# Patient Record
Sex: Male | Born: 1970 | Race: White | Hispanic: No | Marital: Married
Health system: Southern US, Community
[De-identification: ages and names within clinical notes are randomized; demographics above are authoritative.]

---

## 2005-01-28 ENCOUNTER — Ambulatory Visit: Payer: Self-pay

## 2005-02-26 ENCOUNTER — Ambulatory Visit: Payer: Self-pay

## 2006-09-13 ENCOUNTER — Ambulatory Visit: Payer: Self-pay | Admitting: Gastroenterology

## 2006-09-13 IMAGING — US ABDOMEN ULTRASOUND
1 series · 17 of 25 positions shown · non-contrast
Comparison: none

REASON FOR EXAM: RUQ LUQ epigastric pain nausea
COMMENTS:

[Series 1: abdomen ultrasound · 17 of 56 slices shown]
[im 1/56]
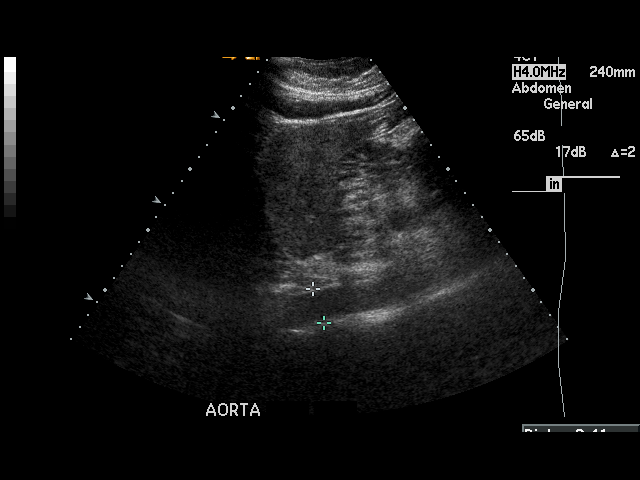
[im 5/56]
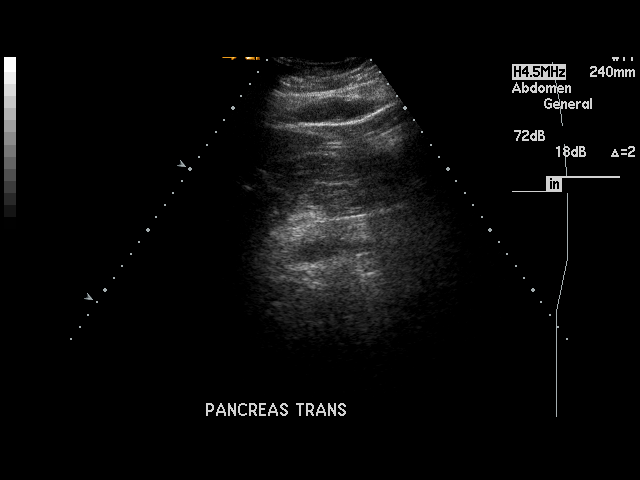
[im 7/56]
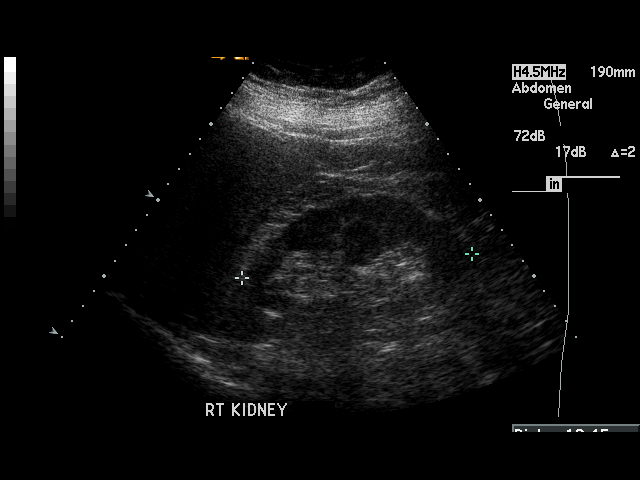
[im 12/56]
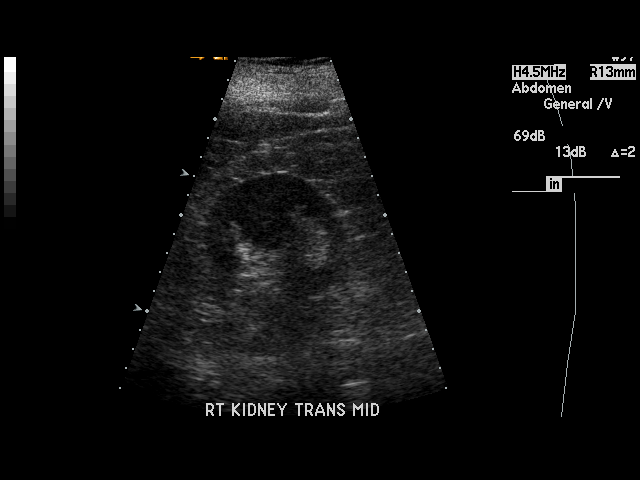
[im 14/56]
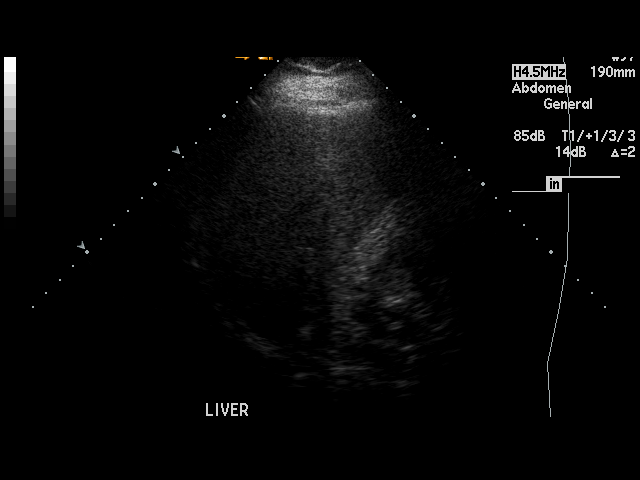
[im 19/56]
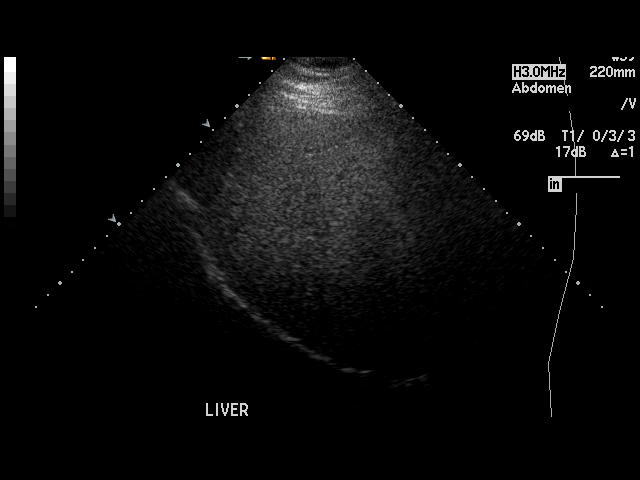
[im 21/56]
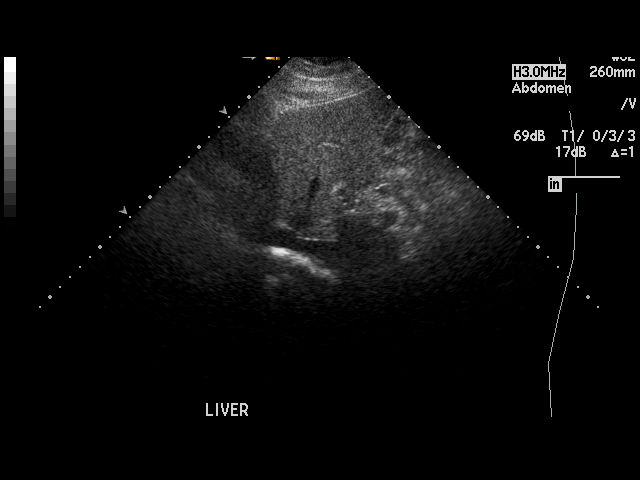
[im 26/56]
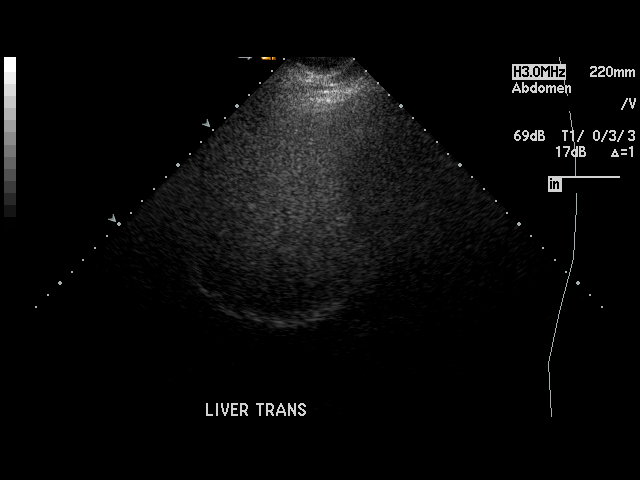
[im 28/56]
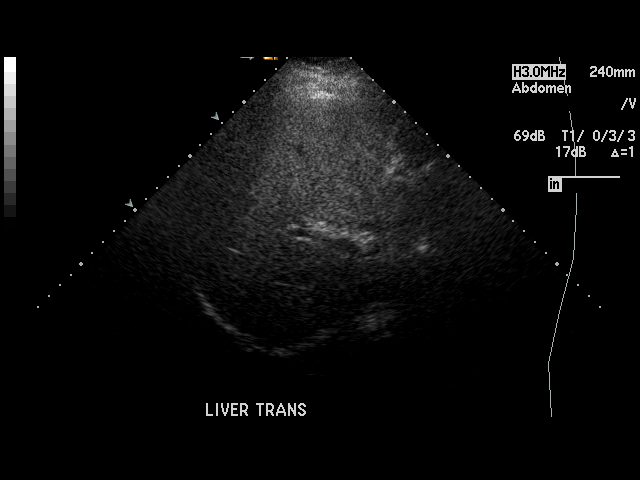
[im 30/56]
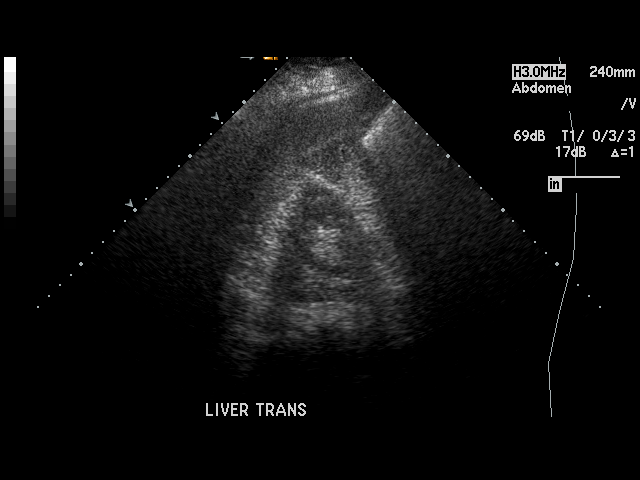
[im 35/56]
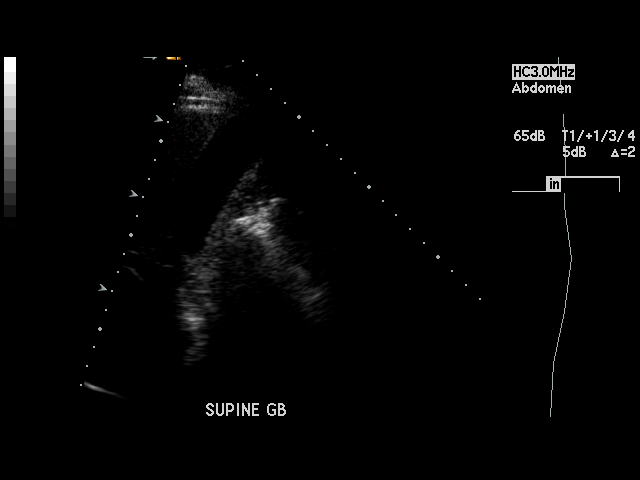
[im 37/56]
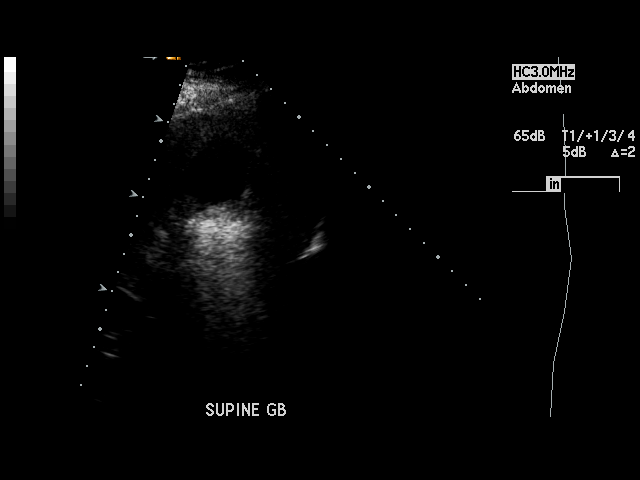
[im 42/56]
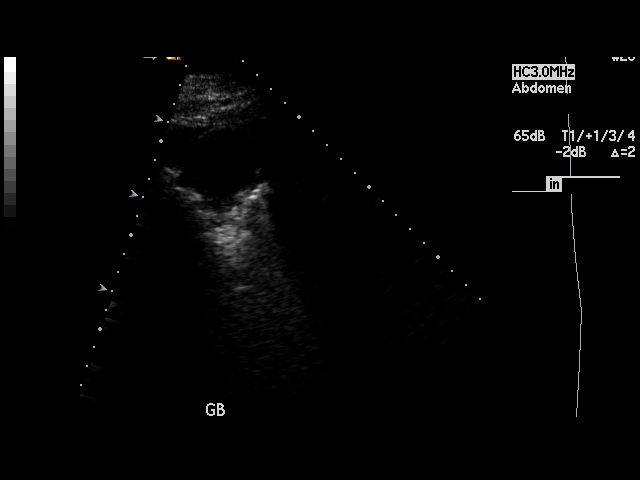
[im 44/56]
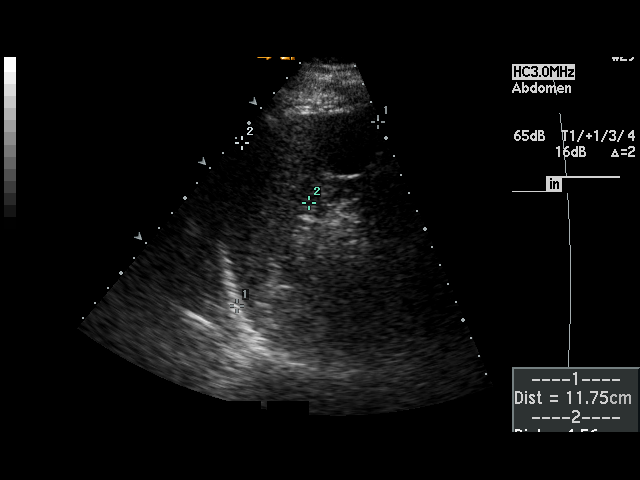
[im 49/56]
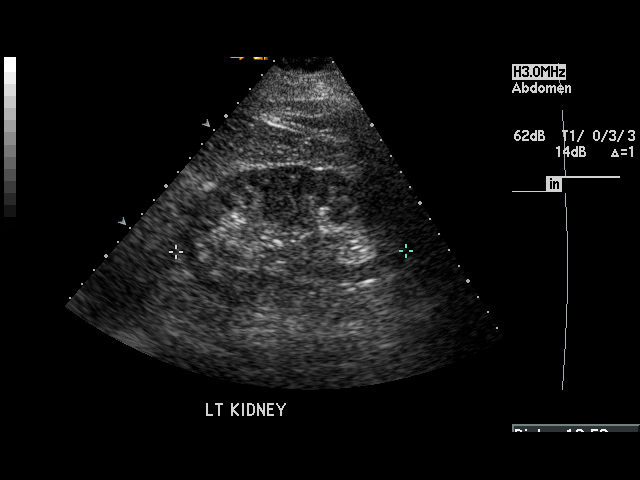
[im 51/56]
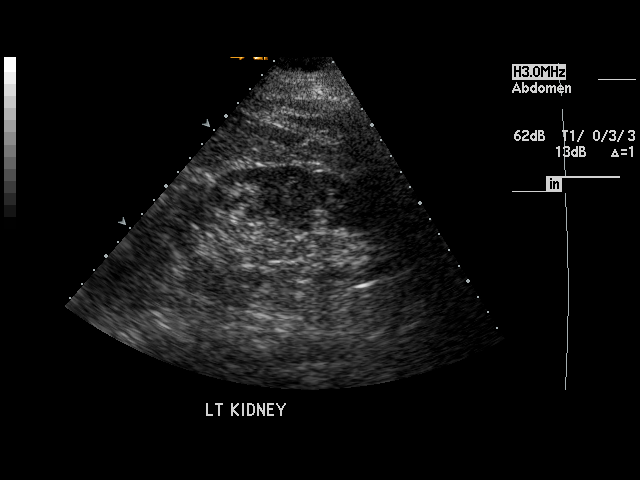
[im 56/56]
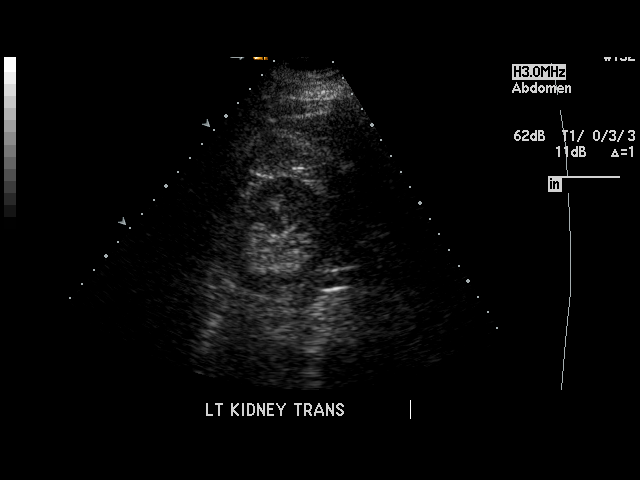

[17 of 25 positions shown; findings below may reference images not displayed]

PROCEDURE:     US  - US ABDOMEN GENERAL SURVEY  - [DATE]  [DATE]

RESULT:     The liver is dense, suspicious for fatty infiltration. No focal
hepatic mass lesions are seen. The spleen is normal in size. The pancreatic
tail is not well seen but the pancreas otherwise is normal appearance. No
gallstones are seen. There is no thickening the gallbladder wall. The common
bile duct measures 3.4 mm in diameter which is within normal limits. The
kidneys show no hydronephrosis. There is no ascites.
IMPRESSION: Probable fatty infiltration of the liver. Otherwise normal study.

## 2006-09-17 ENCOUNTER — Ambulatory Visit: Payer: Self-pay | Admitting: Gastroenterology

## 2006-09-17 IMAGING — NM NUCLEAR MEDICINE HEPATOHBILIARY INCLUDE GB
3 series · 21 of 21 positions shown · non-contrast
Comparison: none

REASON FOR EXAM: RUQ and LUQ epigastric pain, nausea
COMMENTS:

[Series 1000: gallbladder dynamic (results) · 4.80mm/px · 6 of 60 frames shown]
[frame 6/60]
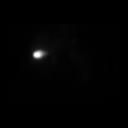
[frame 16/60]
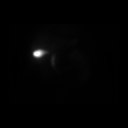
[frame 26/60]
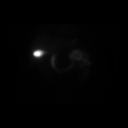
[frame 36/60]
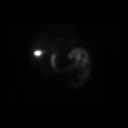
[frame 46/60]
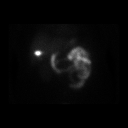
[frame 56/60]
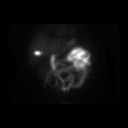

[Series 1000: gallbladder statics · 4.80mm/px · 9 of 9 slices shown]
[im 1/9]
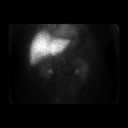
[im 2/9]
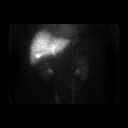
[im 3/9]
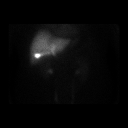
[im 4/9]
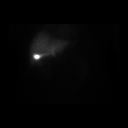
[im 5/9]
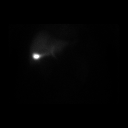
[im 6/9]
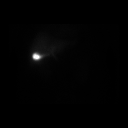
[im 7/9]
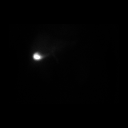
[im 8/9]
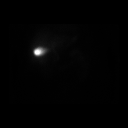
[im 9/9]
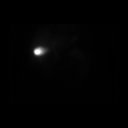

[Series 1000: gallbladder dynamic · 4.80mm/px · 6 of 60 frames shown]
[frame 6/60]
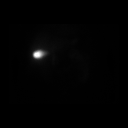
[frame 16/60]
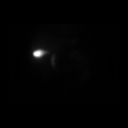
[frame 26/60]
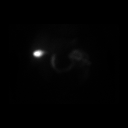
[frame 36/60]
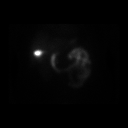
[frame 46/60]
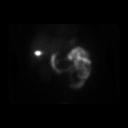
[frame 56/60]
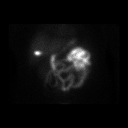

[21 of 21 positions shown; findings below may reference images not displayed]

PROCEDURE:     NM  - NM HEPATO WITH GB EJECT FRACTION  - [DATE]  [DATE]

RESULT:     Following injection of 7.18 mCi of Technetium 99m Choletec,
there is noted prompt visualization of tracer activity in the liver at 3
minutes. At 55 minutes, tracer activity is visualized in the gallbladder,
common duct and proximal small bowel.

The gallbladder ejection fraction at 30 minutes measures 94% which is within
the normal range.
IMPRESSION: 1.  Normal Hepatobiliary Scan.
2.  The gallbladder ejection fraction measures 94% which is in the normal
range.

## 2006-09-25 ENCOUNTER — Ambulatory Visit: Payer: Self-pay | Admitting: Gastroenterology

## 2006-09-30 ENCOUNTER — Ambulatory Visit: Payer: Self-pay | Admitting: Emergency Medicine

## 2006-09-30 ENCOUNTER — Emergency Department: Payer: Self-pay | Admitting: Emergency Medicine

## 2006-09-30 ENCOUNTER — Other Ambulatory Visit: Payer: Self-pay

## 2006-09-30 IMAGING — CT CT ABD-PELV W/ CM
1 of 2 series · 15 of 32 positions shown, 20 images · non-contrast
Comparison: none

REASON FOR EXAM: epigastric pain, satiety x 1 month
COMMENTS:

[Series 2: abdomen · axial · 0.84mm/px · z∈[-564,-76]mm · 15 of 67 slices shown, 20 images]
[im 3/67  soft-tissue]
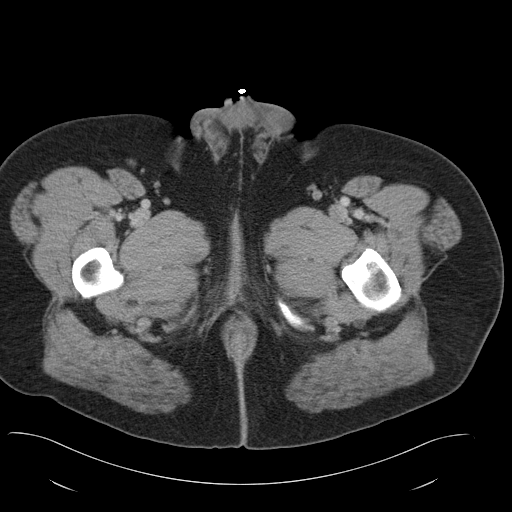
[im 3/67  bone]
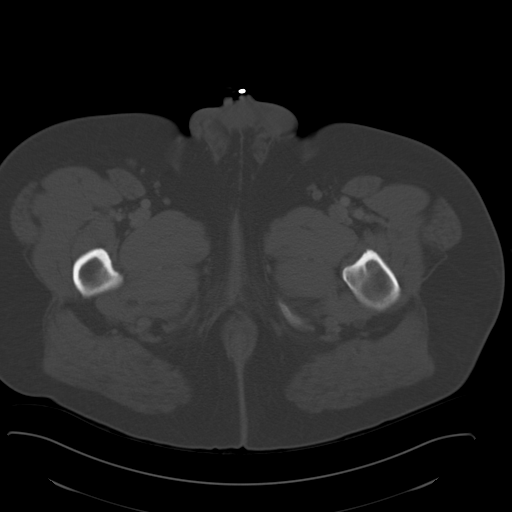
[im 9/67  soft-tissue]
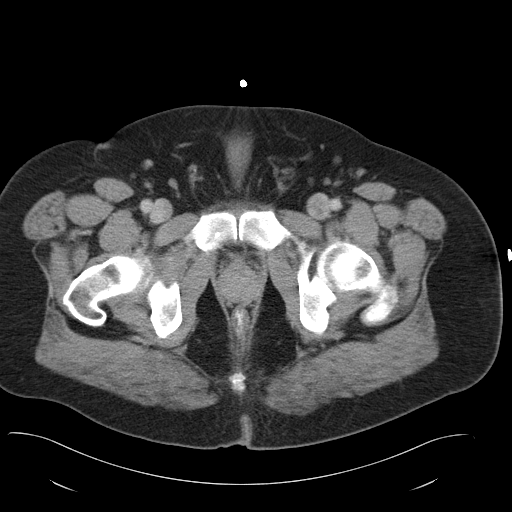
[im 12/67  soft-tissue]
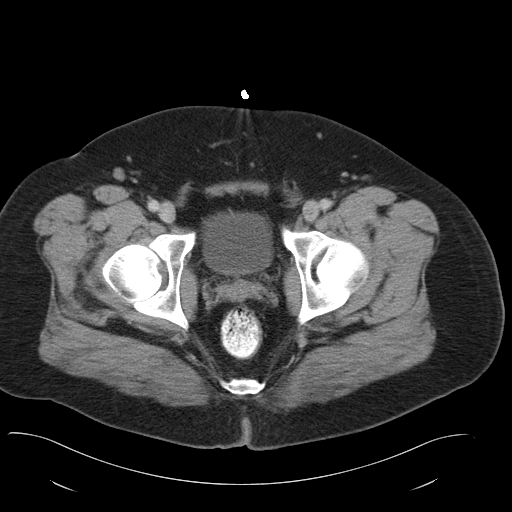
[im 18/67  soft-tissue]
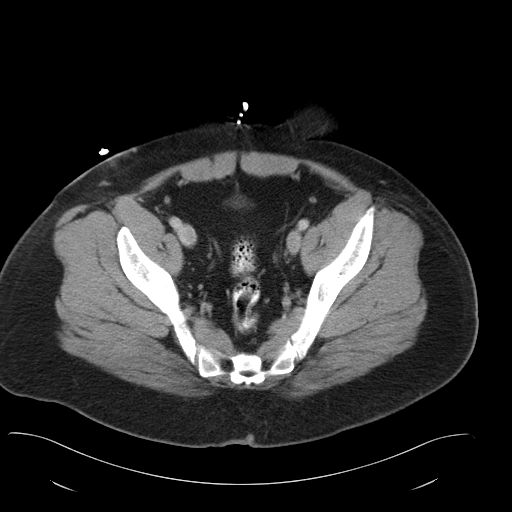
[im 23/67  soft-tissue]
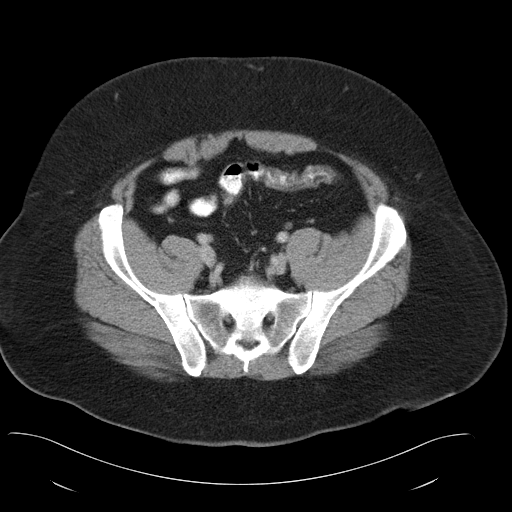
[im 26/67  soft-tissue]
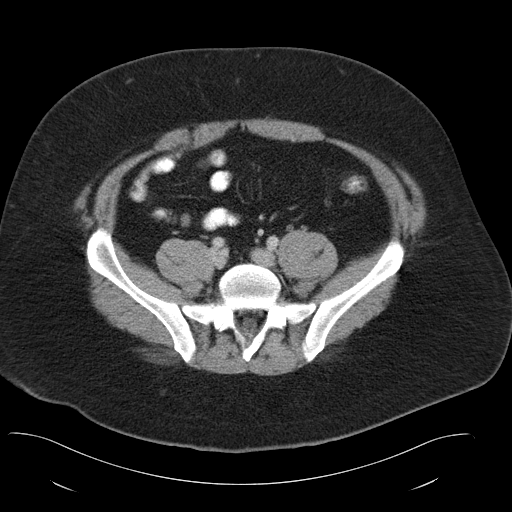
[im 32/67  soft-tissue]
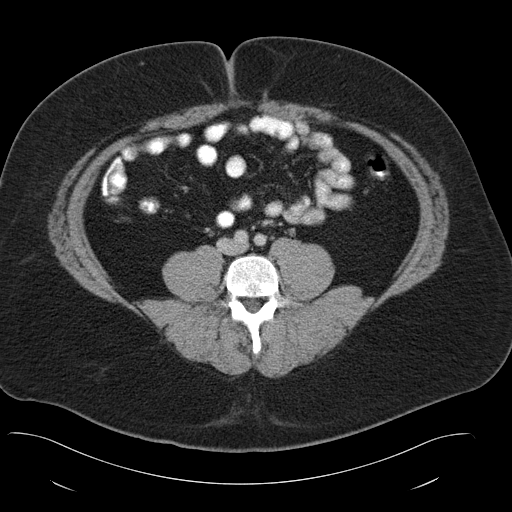
[im 35/67  soft-tissue]
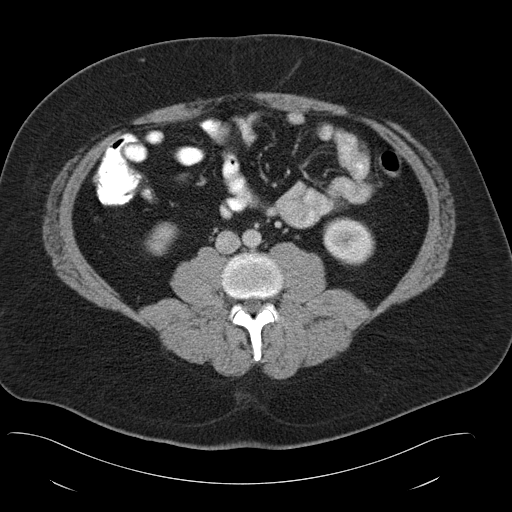
[im 41/67  soft-tissue]
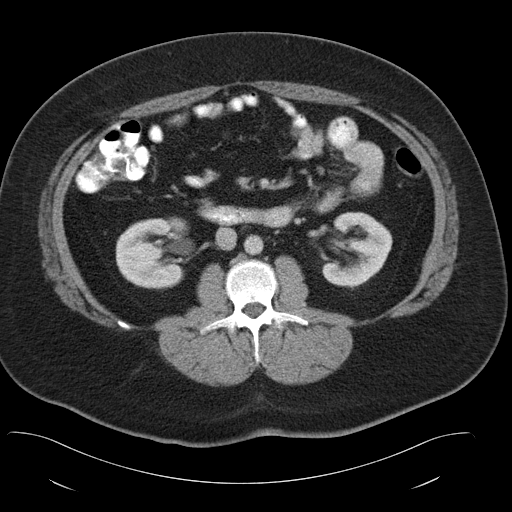
[im 41/67  bone]
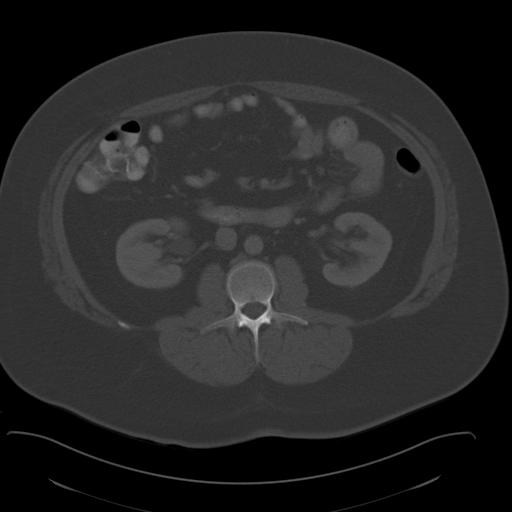
[im 44/67  soft-tissue]
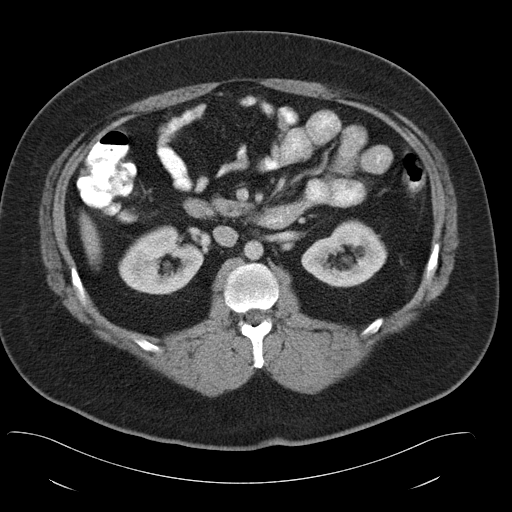
[im 49/67  soft-tissue]
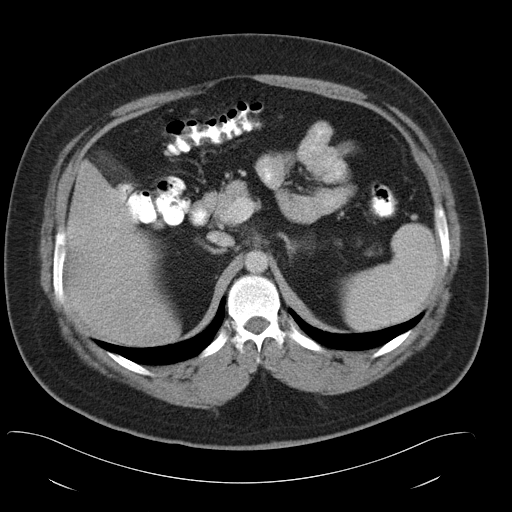
[im 55/67  soft-tissue]
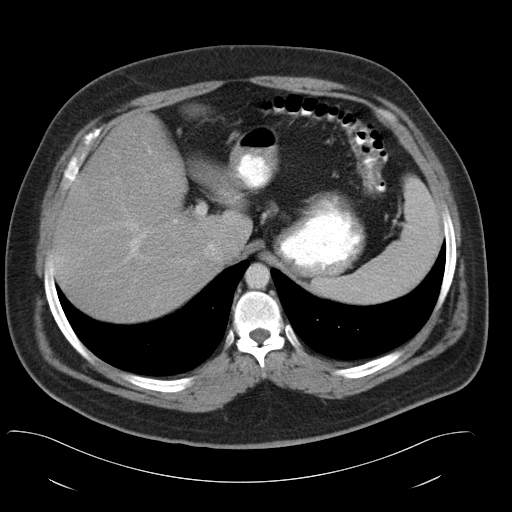
[im 55/67  lung]
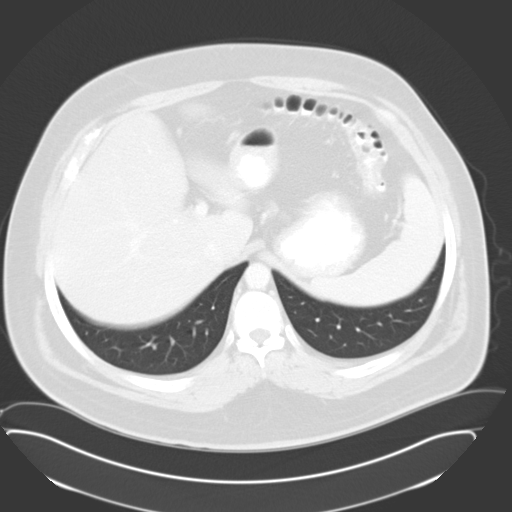
[im 58/67  soft-tissue]
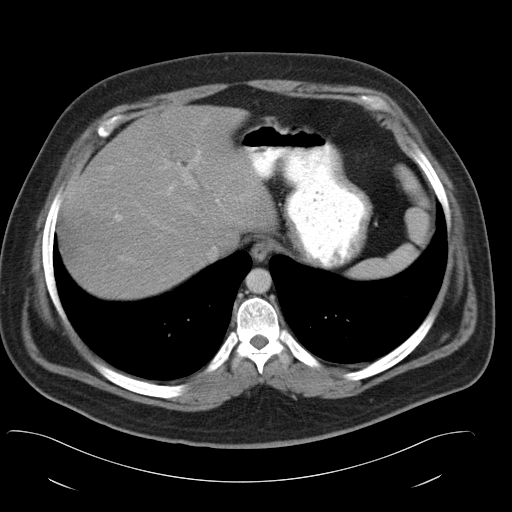
[im 58/67  lung]
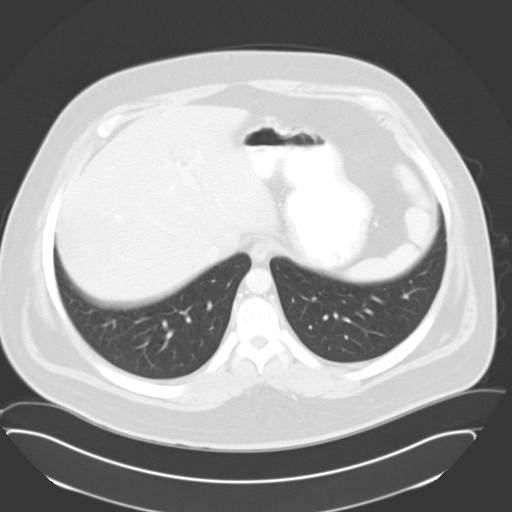
[im 61/67  lung]
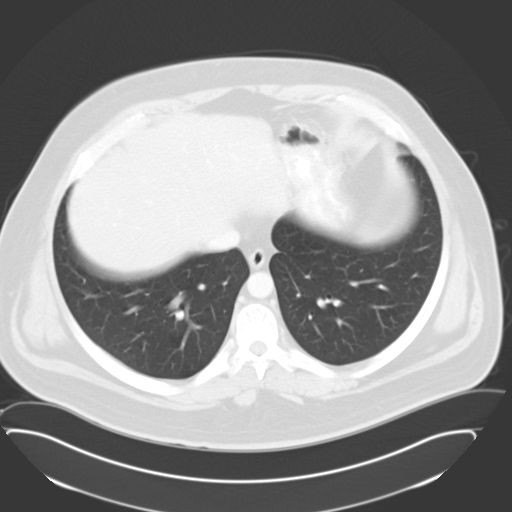
[im 64/67  soft-tissue]
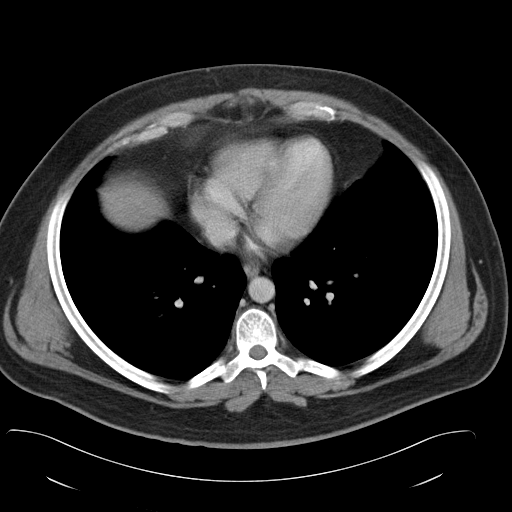
[im 64/67  lung]
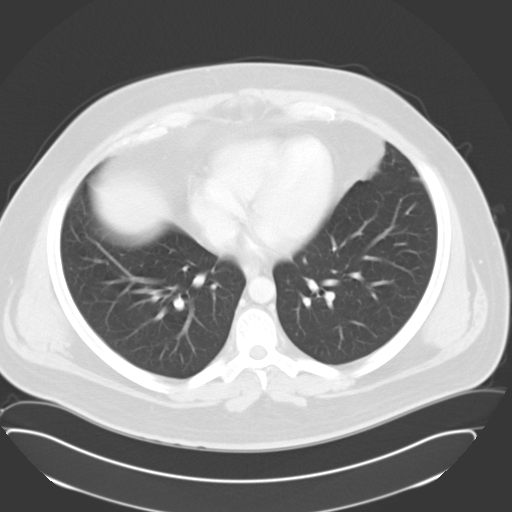

[15 of 32 positions shown; findings below may reference images not displayed]

PROCEDURE:     CT  - CT ABDOMEN / PELVIS  W  - [DATE] [DATE]

RESULT:     CT imaging of the abdomen and pelvis with oral and IV contrast
is performed. The lung bases are clear. There is no pleural or pericardial
effusion. No radiopaque gallstones are evident. There is no inflammatory
stranding, bowel wall thickening or abnormal bowel distention. The appendix
is not identified but there is no evidence of appendicitis. The aorta is
normal in caliber. The kidneys appear to enhance normally and demonstrate no
obstructive change or definite stones. The liver, spleen, pancreas, adrenal
glands and gallbladder appear to be unremarkable. There is no adenopathy.
IMPRESSION: 1. Unremarkable CT of the abdomen and pelvis.

## 2018-08-19 DEATH — deceased

## 2018-11-29 ENCOUNTER — Other Ambulatory Visit: Payer: Self-pay | Admitting: Internal Medicine

## 2018-11-29 DIAGNOSIS — M542 Cervicalgia: Secondary | ICD-10-CM

## 2018-12-05 ENCOUNTER — Ambulatory Visit: Payer: 59

## 2018-12-18 ENCOUNTER — Other Ambulatory Visit: Payer: Self-pay | Admitting: Internal Medicine

## 2018-12-18 DIAGNOSIS — M5412 Radiculopathy, cervical region: Secondary | ICD-10-CM

## 2018-12-28 ENCOUNTER — Other Ambulatory Visit: Payer: Self-pay

## 2018-12-28 ENCOUNTER — Ambulatory Visit
Admission: RE | Admit: 2018-12-28 | Discharge: 2018-12-28 | Disposition: A | Discharge status: Home / Self Care | Payer: 59 | Source: Ambulatory Visit | Attending: Internal Medicine | Admitting: Internal Medicine

## 2018-12-28 DIAGNOSIS — M5412 Radiculopathy, cervical region: Secondary | ICD-10-CM | POA: Diagnosis present

## 2018-12-28 IMAGING — MR MR CERVICAL SPINE W/O CM
5 series · 39 of 48 positions shown · non-contrast
Comparison: None.

CLINICAL DATA: Cervical radiculopathy. Left arm numbness and
tingling for 1 month.

EXAM:
MRI CERVICAL SPINE WITHOUT CONTRAST
TECHNIQUE: Multiplanar, multisequence MR imaging of the cervical spine was
performed. No intravenous contrast was administered.

[Series 5: T2 · sagittal · 3.0mm · 0.62mm/px · 7 of 15 slices shown (1 of 2)]
[im 1/15]
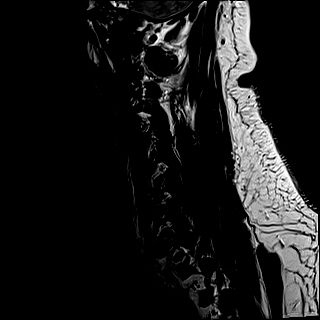
[im 3/15]
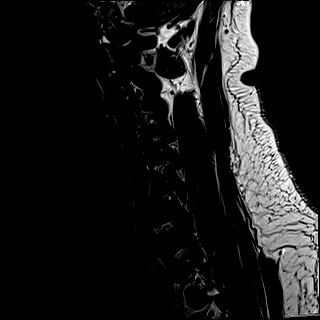
[im 5/15]
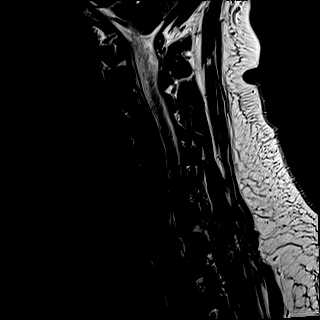
[im 8/15]
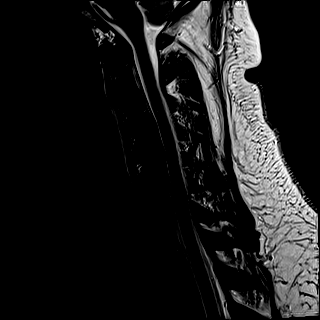
[im 10/15]
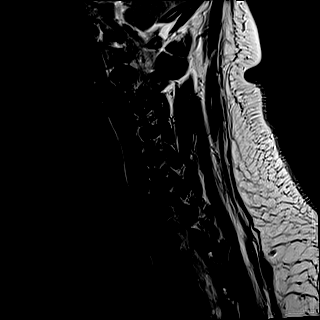
[im 12/15]
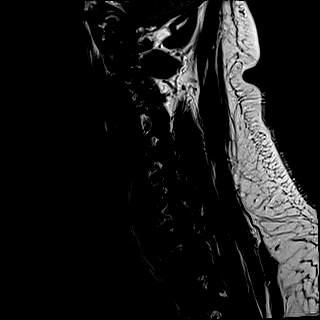
[im 15/15]
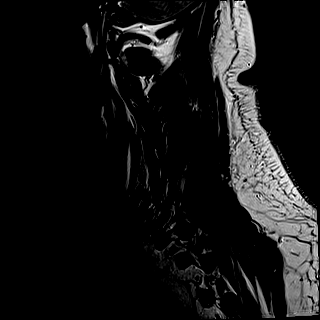

[Series 6: FLAIR · sagittal · 3.0mm · 0.78mm/px · 7 of 15 slices shown]
[im 1/15]
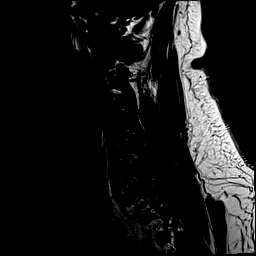
[im 3/15]
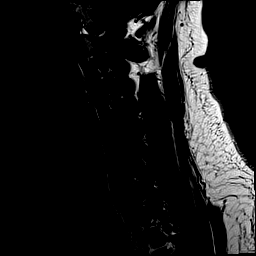
[im 5/15]
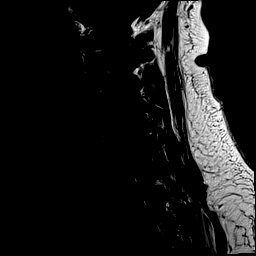
[im 8/15]
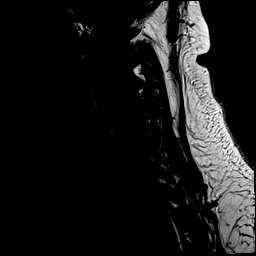
[im 10/15]
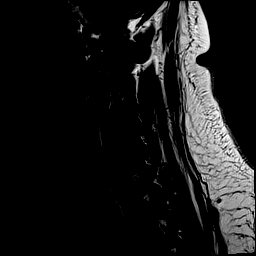
[im 12/15]
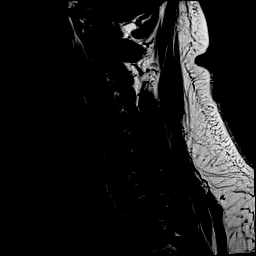
[im 15/15]
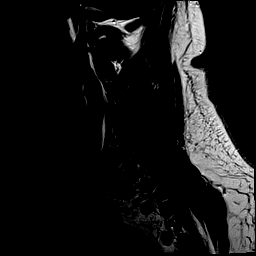

[Series 7: STIR · sagittal · 3.0mm · 0.62mm/px · 7 of 15 slices shown]
[im 1/15]
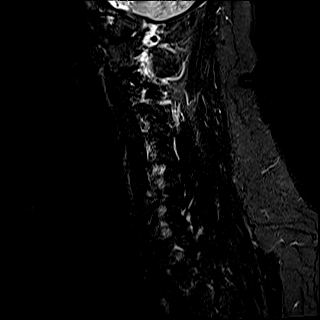
[im 3/15]
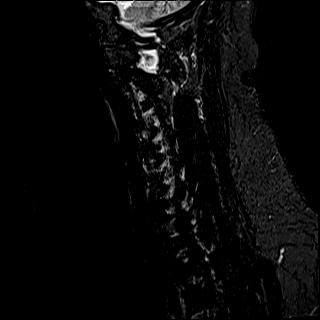
[im 5/15]
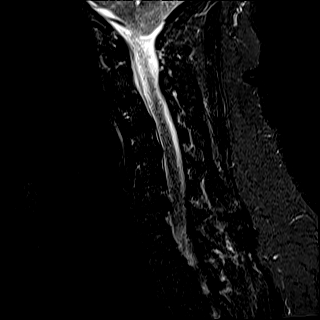
[im 8/15]
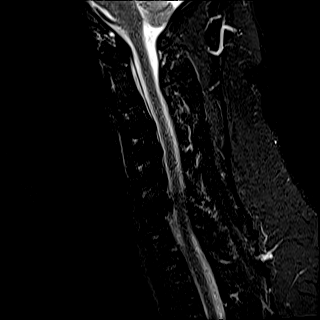
[im 10/15]
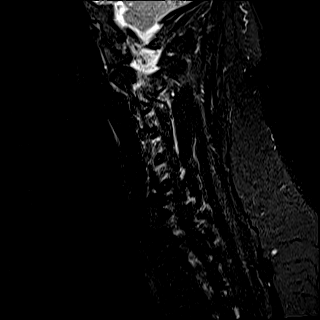
[im 12/15]
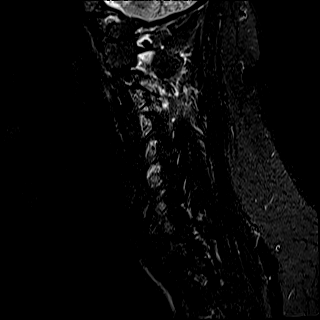
[im 15/15]
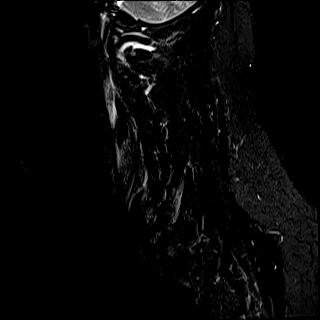

[Series 8: T2 · axial · 3.0mm · 0.70mm/px · z∈[-22,+81]mm · 10 of 31 slices shown (2 of 2)]
[im 1/31]
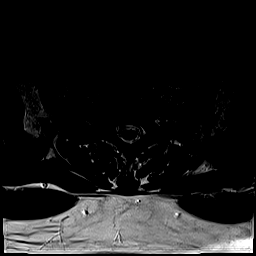
[im 3/31]
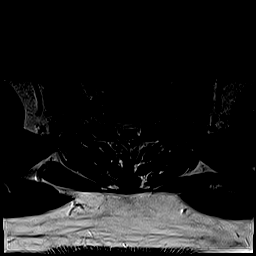
[im 5/31]
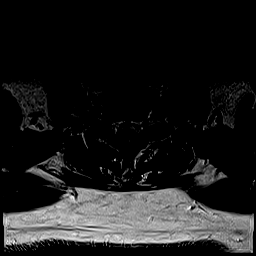
[im 7/31]
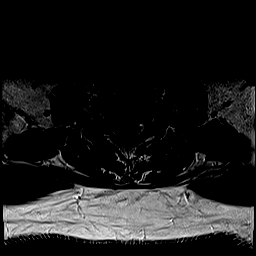
[im 10/31]
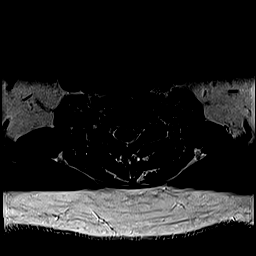
[im 14/31]
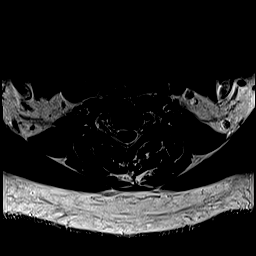
[im 17/31]
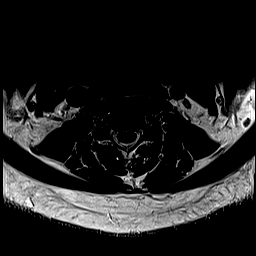
[im 21/31]
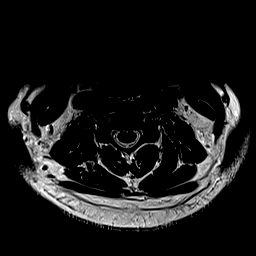
[im 26/31]
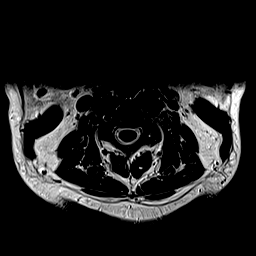
[im 31/31]
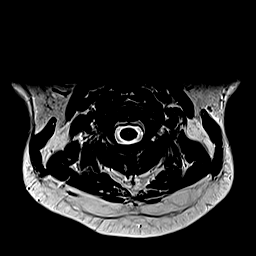

[Series 9: ax mpgr · axial · 3.0mm · 0.35mm/px · z∈[-19,+77]mm · 8 of 29 slices shown]
[im 1/29]
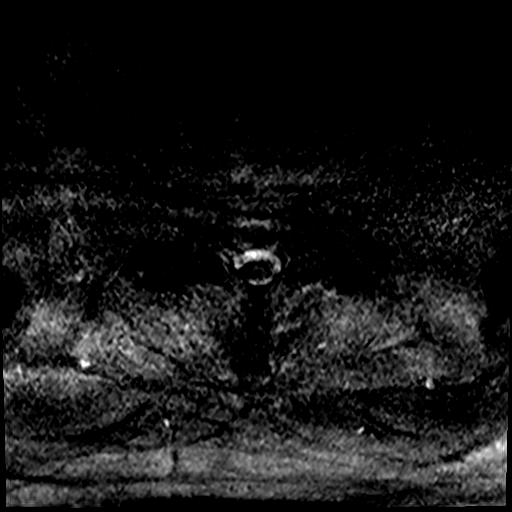
[im 5/29]
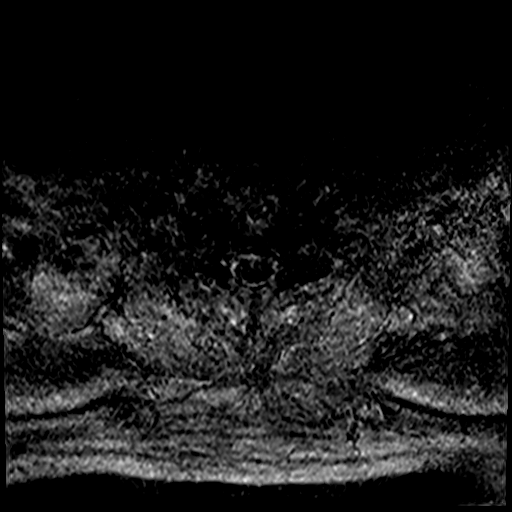
[im 10/29]
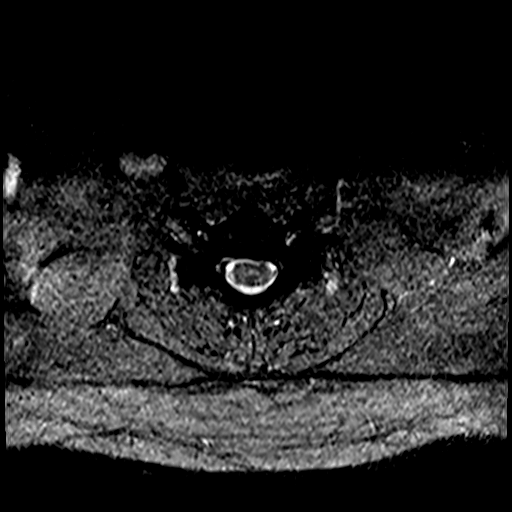
[im 12/29]
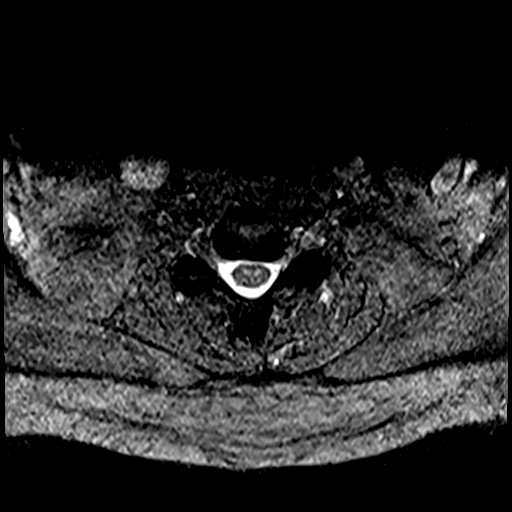
[im 17/29]
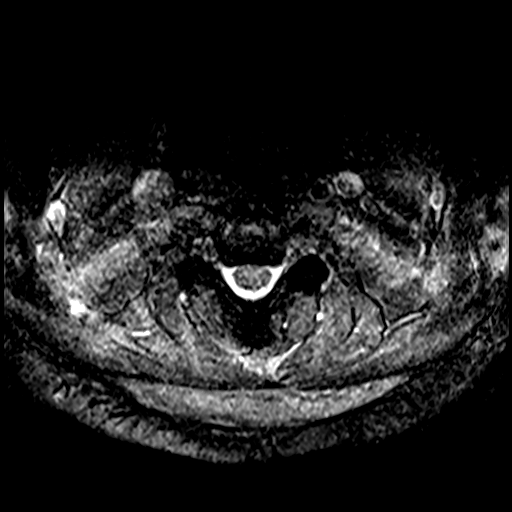
[im 19/29]
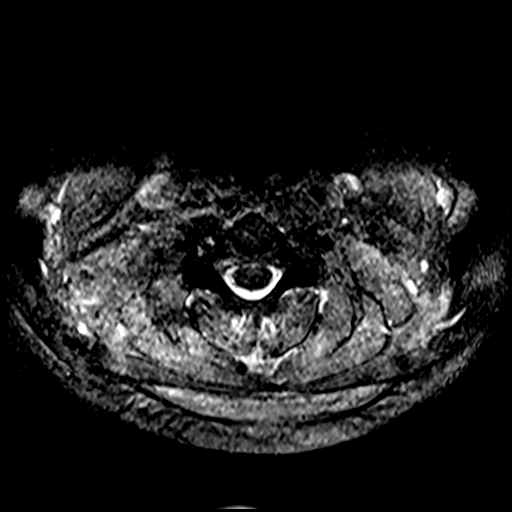
[im 24/29]
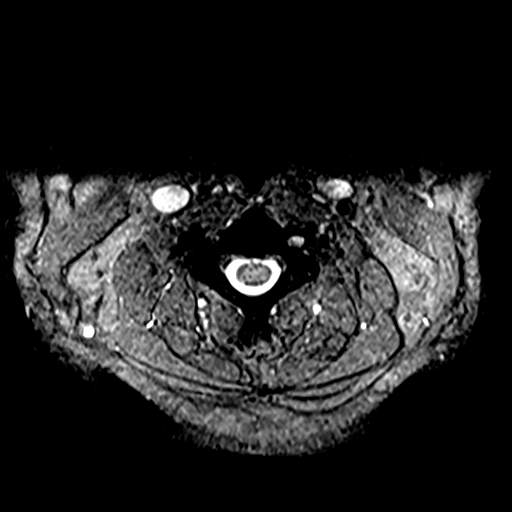
[im 29/29]
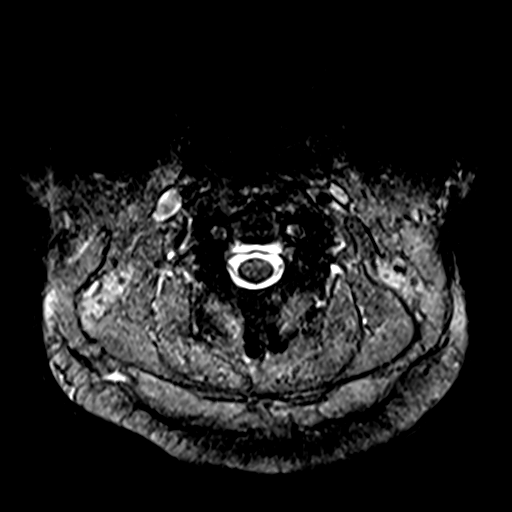

[39 of 48 positions shown; findings below may reference images not displayed]

FINDINGS: Alignment: Mild reversal of the normal cervical lordosis. No
listhesis.

Vertebrae: No fracture or suspicious marrow lesion. Extensive
left-sided facet edema at C2-3.

Cord: Normal signal and morphology.

Posterior Fossa, vertebral arteries, paraspinal tissues:
Unremarkable.

Disc levels:

C2-3: Asymmetric severe left facet arthrosis without disc herniation
or significant stenosis.

C3-4: Negative.

C4-5: Mild disc bulging and minimal uncovertebral spurring without
significant stenosis.

C5-6: Minimal disc bulging without significant stenosis.

C6-7: Mild-to-moderate disc space narrowing. Mild disc bulging and
uncovertebral spurring result in mild spinal stenosis and moderate
left greater than right neural foraminal stenosis with potential
left C7 nerve root impingement.

C7-T1: Negative.
IMPRESSION: 1. Disc degeneration greatest at C6-7 where there is mild spinal
stenosis and moderate neural foraminal stenosis with potential left
C7 nerve root impingement.
2. Severe left facet arthritis at C2-3 with extensive edema.

## 2020-01-08 ENCOUNTER — Other Ambulatory Visit: Payer: Self-pay | Admitting: Orthopedic Surgery

## 2020-01-08 DIAGNOSIS — M25561 Pain in right knee: Secondary | ICD-10-CM

## 2020-01-08 DIAGNOSIS — M2351 Chronic instability of knee, right knee: Secondary | ICD-10-CM

## 2020-01-08 DIAGNOSIS — M2391 Unspecified internal derangement of right knee: Secondary | ICD-10-CM

## 2020-01-31 ENCOUNTER — Other Ambulatory Visit: Payer: Self-pay

## 2020-01-31 ENCOUNTER — Ambulatory Visit
Admission: RE | Admit: 2020-01-31 | Discharge: 2020-01-31 | Disposition: A | Discharge status: Home / Self Care | Payer: 59 | Source: Ambulatory Visit | Attending: Orthopedic Surgery | Admitting: Orthopedic Surgery

## 2020-01-31 DIAGNOSIS — M2351 Chronic instability of knee, right knee: Secondary | ICD-10-CM

## 2020-01-31 DIAGNOSIS — M2391 Unspecified internal derangement of right knee: Secondary | ICD-10-CM

## 2020-01-31 DIAGNOSIS — M25561 Pain in right knee: Secondary | ICD-10-CM

## 2020-01-31 IMAGING — MR MR KNEE*R* W/O CM
4 of 6 series · 18 of 40 positions shown · non-contrast
Comparison: None.

CLINICAL DATA: Medial right knee pain for 1 month

EXAM:
MRI OF THE RIGHT KNEE WITHOUT CONTRAST
TECHNIQUE: Multiplanar, multisequence MR imaging of the knee was performed. No
intravenous contrast was administered.

[Series 3: T2 fat-sat · axial · 4.0mm · 0.31mm/px · z∈[-41,+47]mm · 3 of 25 slices shown (1 of 2)]
[im 5/25]
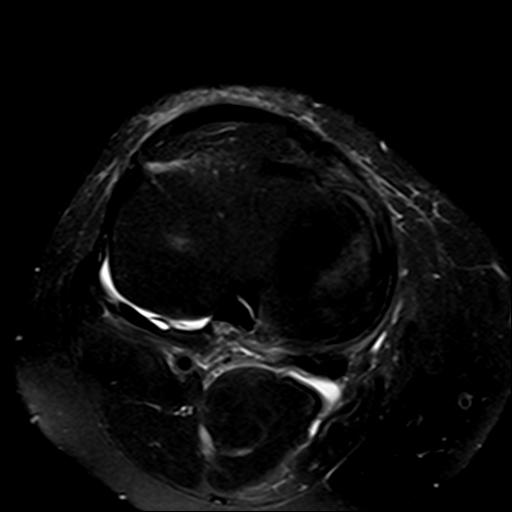
[im 15/25]
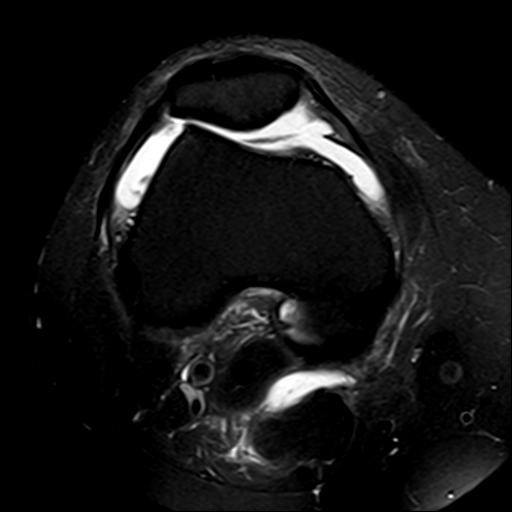
[im 25/25]
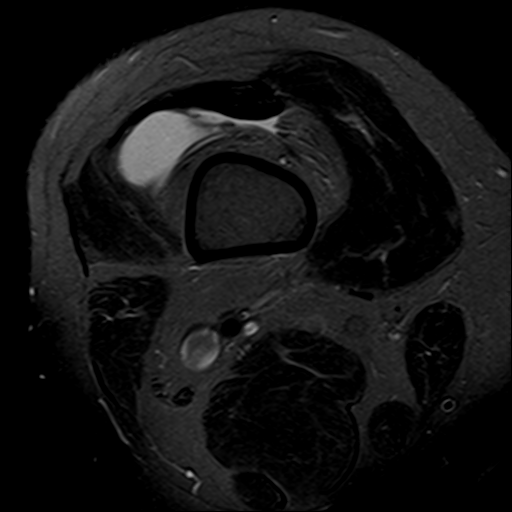

[Series 5: T2 fat-sat · coronal · 4.0mm · 0.29mm/px · 3 of 24 slices shown (2 of 2)]
[im 5/24]
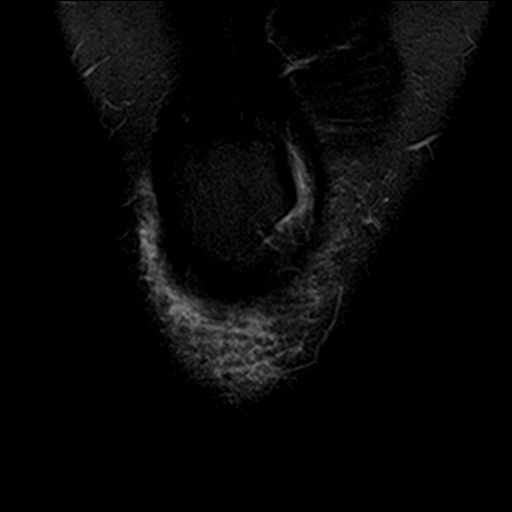
[im 14/24]
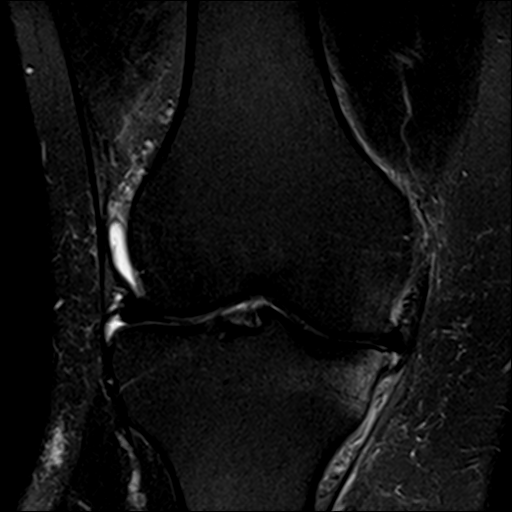
[im 24/24]
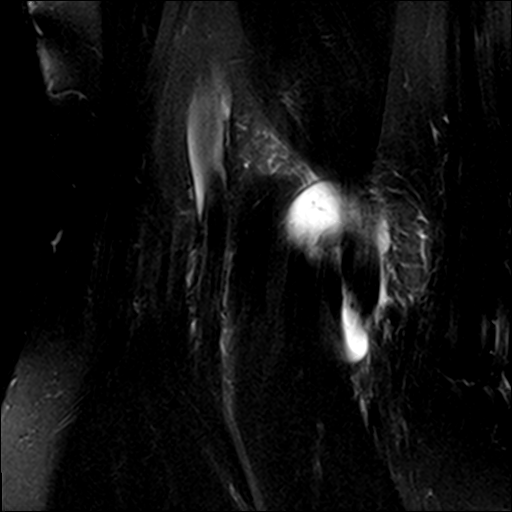

[Series 6: PD fat-sat · coronal · 3.0mm · 0.29mm/px · 7 of 28 slices shown (1 of 2)]
[im 1/28]
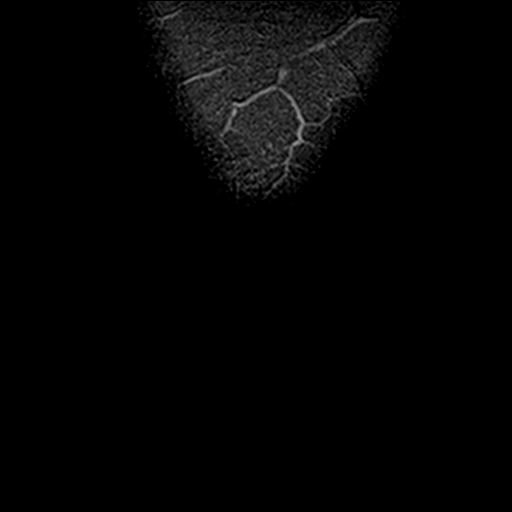
[im 5/28]
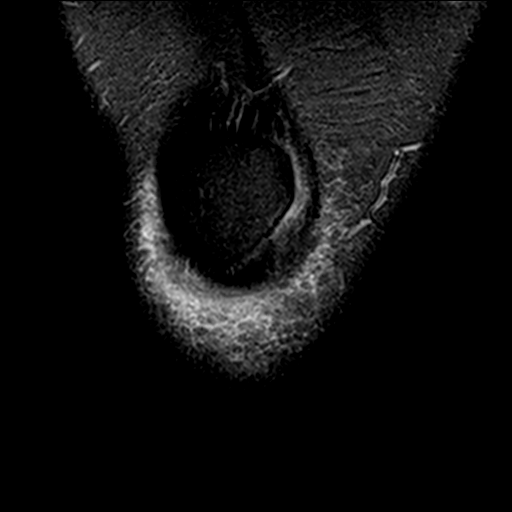
[im 10/28]
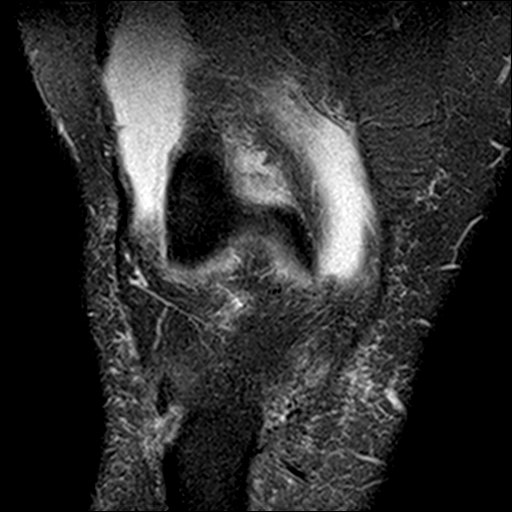
[im 14/28]
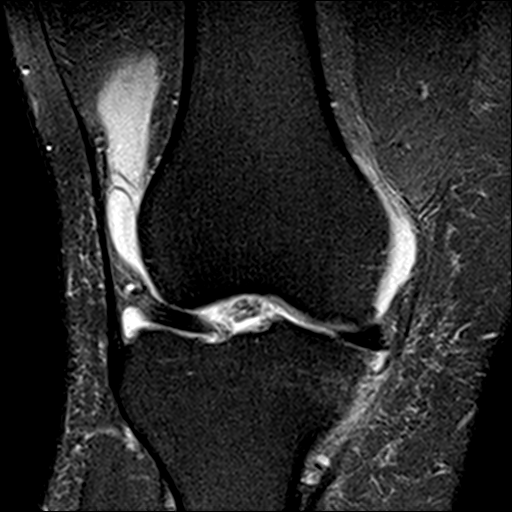
[im 19/28]
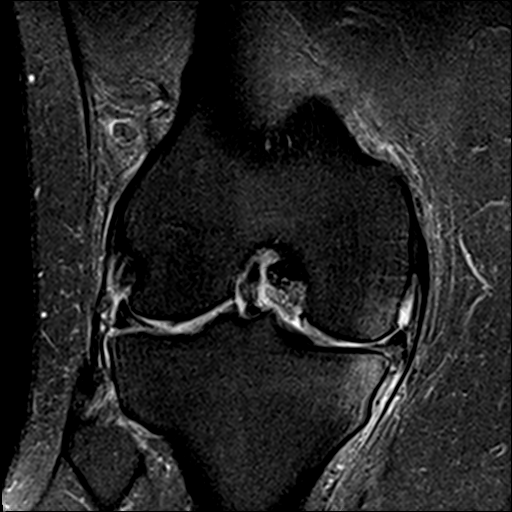
[im 23/28]
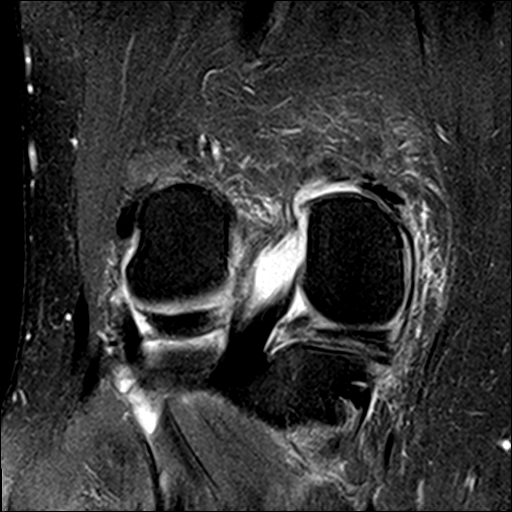
[im 28/28]
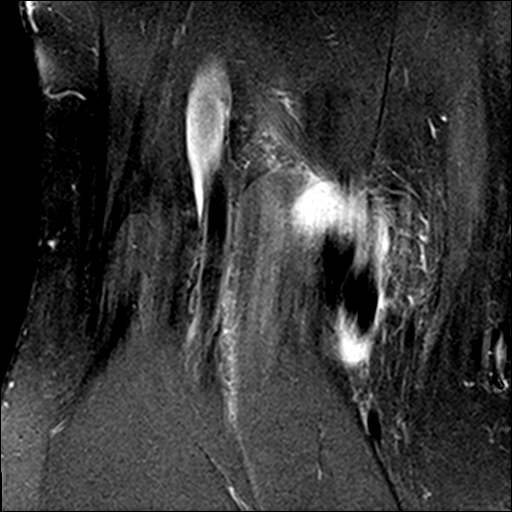

[Series 7: PD fat-sat · sagittal · 3.0mm · 0.29mm/px · 5 of 30 slices shown (2 of 2)]
[im 1/30]
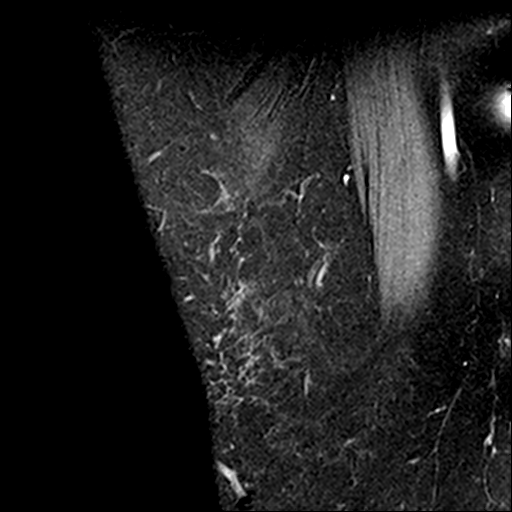
[im 5/30]
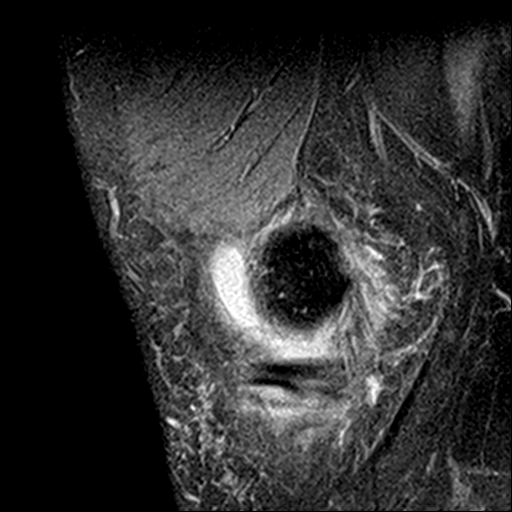
[im 9/30]
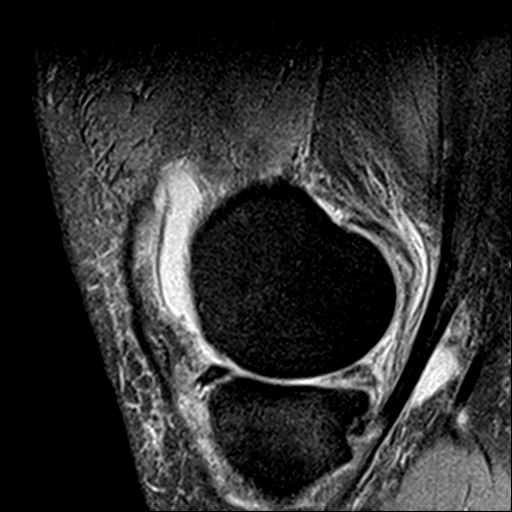
[im 17/30]
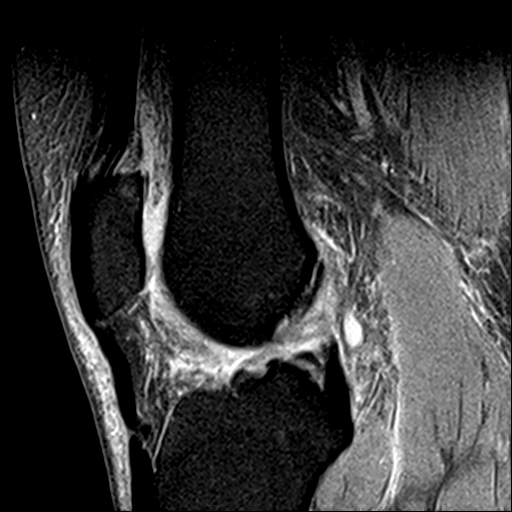
[im 25/30]
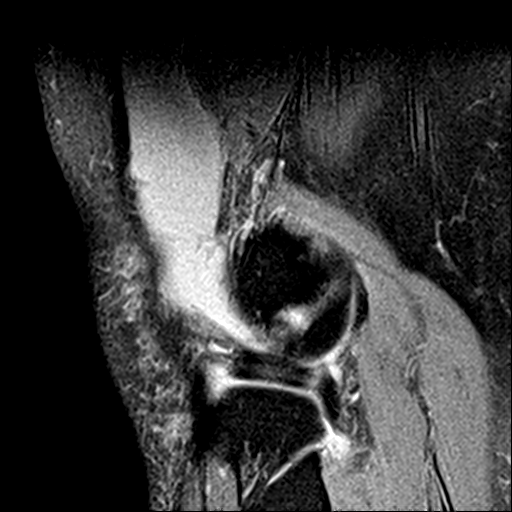

[18 of 40 positions shown; findings below may reference images not displayed]

FINDINGS: Despite efforts by the technologist and patient, motion artifact is
present on today's exam and could not be eliminated. This reduces
exam sensitivity and specificity.

MENISCI

Medial meniscus: Large radial tear of the posterior horn near the
meniscal root. Adjacent degenerative tearing and adjacent grade 3
oblique tear extends from the periphery to the inferior surface in
the posterior horn. Findings observed on images 18-24 series 7.

Lateral meniscus: Small grade 3 tear of the posterior horn and
midbody extends to the inferior meniscal surface as on image 10 of
series 6.

LIGAMENTS

Cruciates:  Unremarkable

Collaterals:  Unremarkable

CARTILAGE

Patellofemoral: Prominent chondral thinning along significant
portions of the medial and lateral facets with moderate chondral
thinning in the femoral trochlear groove.

Medial: Prominent degenerative chondral thinning with associated
subcortical marrow edema. Marginal spurring.

Lateral: Moderate degenerative chondral thinning. 0.6 cm
non-fragmented osteochondral lesion medially in the lateral tibial
plateau on image 10 of series 5.

Joint:  Moderate knee effusion.  Mildly thickened medial plica.

Popliteal Fossa: Moderate size Baker's cyst. Infiltrative edema
tracks along the popliteal space.

Extensor Mechanism:  Unremarkable

Bones: No significant extra-articular osseous abnormalities
identified.

Other: No supplemental non-categorized findings.
IMPRESSION: 1. Large radial tear of the posterior horn medial meniscus near the
meniscal root with adjacent degenerative tearing and adjacent grade
3 oblique tear in the posterior horn.
2. Small grade 3 tear of the posterior horn and midbody lateral
meniscus extending to the inferior meniscal surface.
3. Moderate to prominent osteoarthritis.
4. Moderate knee effusion with mildly thickened medial plica.
5. Moderate size Baker's cyst. Infiltrative edema tracks along the
popliteal space.

## 2020-03-01 ENCOUNTER — Other Ambulatory Visit: Payer: Self-pay | Admitting: Orthopedic Surgery

## 2020-03-01 DIAGNOSIS — M25569 Pain in unspecified knee: Secondary | ICD-10-CM

## 2020-03-01 NOTE — Progress Notes (Signed)
Bilateral standing hip to ankle x-rays on one cassette for evaluation of alignment 

## 2022-05-29 ENCOUNTER — Other Ambulatory Visit: Payer: Self-pay | Admitting: Internal Medicine

## 2022-05-29 DIAGNOSIS — E7849 Other hyperlipidemia: Secondary | ICD-10-CM

## 2022-06-07 ENCOUNTER — Ambulatory Visit
Admission: RE | Admit: 2022-06-07 | Discharge: 2022-06-07 | Disposition: A | Discharge status: Home / Self Care | Payer: 59 | Source: Ambulatory Visit | Attending: Internal Medicine | Admitting: Internal Medicine

## 2022-06-07 DIAGNOSIS — E7849 Other hyperlipidemia: Secondary | ICD-10-CM

## 2022-12-05 ENCOUNTER — Other Ambulatory Visit: Payer: Self-pay | Admitting: Internal Medicine

## 2022-12-05 DIAGNOSIS — R1084 Generalized abdominal pain: Secondary | ICD-10-CM

## 2022-12-12 ENCOUNTER — Ambulatory Visit
Admission: RE | Admit: 2022-12-12 | Discharge: 2022-12-12 | Disposition: A | Discharge status: Home / Self Care | Payer: 59 | Source: Ambulatory Visit | Attending: Internal Medicine | Admitting: Internal Medicine

## 2022-12-12 DIAGNOSIS — R1084 Generalized abdominal pain: Secondary | ICD-10-CM

## 2023-11-02 ENCOUNTER — Other Ambulatory Visit: Payer: Self-pay | Admitting: Internal Medicine

## 2023-11-02 DIAGNOSIS — R1011 Right upper quadrant pain: Secondary | ICD-10-CM

## 2023-11-06 ENCOUNTER — Ambulatory Visit
Admission: RE | Admit: 2023-11-06 | Discharge: 2023-11-06 | Disposition: A | Discharge status: Home / Self Care | Source: Ambulatory Visit | Attending: Internal Medicine | Admitting: Internal Medicine

## 2023-11-06 DIAGNOSIS — R1011 Right upper quadrant pain: Secondary | ICD-10-CM | POA: Insufficient documentation

## 2023-12-04 NOTE — Progress Notes (Signed)
 History of Present Illness Brandon Gillespie is a 53 year old male who presents for an annual exam.  He has experienced recent abdominal pain, which has since improved spontaneously. An abdominal ultrasound in August showed fatty infiltration of the liver but was otherwise unremarkable. A similar episode occurred last September, with a CT scan showing no significant findings.  He experiences left knee pain, described as a 'ratchet' sound on movement, and takes meloxicam daily for relief. He is awaiting a referral to an orthopedic specialist for further evaluation.  He has sleep apnea with no worsening of symptoms. He uses a sleep aid containing diphenhydramine to help with sleep, as his schedule varies and affects his sleep quality.  He has hyperlipidemia and is making lifestyle changes, such as avoiding high-calorie breakfasts, to manage his weight. His cholesterol levels are slightly elevated, with a total cholesterol of 202 mg/dL.  He has been told he has prediabetes and has an elevated A1c. He is focusing on weight management to prevent progression to diabetes.  He reports a recent increase in coughing, which his wife is concerned about. 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 He previously used a nasal spray that helped alleviate the cough.  He is on sertraline for depression/anxiety, which he uses as needed and finds effective.  Current Outpatient Medications  Medication Sig Dispense Refill  . meloxicam (MOBIC) 15 MG tablet TAKE ONE TABLET BY MOUTH ONCE A DAY AS NEEDED FOR PAIN 90 tablet 3  . sertraline (ZOLOFT) 50 MG tablet Take 1 tablet (50 mg total) by mouth once daily 90 tablet 3  . ipratropium (ATROVENT) 0.06 % nasal spray Place 2 sprays into both nostrils 2 (two) times daily 15 mL 11   No current facility-administered medications for this visit.    Allergies as of 12/05/2023  . (No Known Allergies)    Past Medical History:  Diagnosis Date  . Abdominal discomfort  2008   Ultrasound with fatty infiltration of the liver, otherwise normal.  HIDA scan with CCK normal.  CT of the abdomen and pelvis 09/2006 unremarkable.  . Dermatitis    recurrent  . Fen-phen history    remote  . H/O balanitis    Recurrent, evaluated by Dr. Twylla 2006.  SABRA Hyperlipidemia   . IBS (irritable bowel syndrome)    Endoscopy 2008 with report of pathology c/w celiac disease.  Celiac blood panel negative.  . Sleep apnea    By sleep study 2006; initiated on CPAP-13 2007.    Past Surgical History:  Procedure Laterality Date  . COLONOSCOPY  09/25/2006   Dr. CHARM Punch @ [facility] - Nml  . UPPER GASTROINTESTINAL ENDOSCOPY  09/25/2006   Dr. CHARM Punch @ [facility]  . COLONOSCOPY  02/24/2019   Tubular adenoma of the colon/Repeat 67yrs/[facility]  . Left anterior cruciate ligament repair    . TONSILLECTOMY     Social History   Tobacco Use  . Smoking status: Never  . Smokeless tobacco: Never  Substance Use Topics  . Alcohol use: Yes    Alcohol/week: 0.0 standard drinks of alcohol    Comment: Occasional  . Drug use: No    Family History  Problem Relation Name Age of Onset  . Coronary Artery Disease (Blocked arteries around heart) Other    . Peripheral vascular disease Other    . High blood pressure (Hypertension) Other       Goals Addressed  This Visit's Progress   . * Maintain health/healthy lifestyle (pt-stated)   On track       Results for orders placed or performed in visit on 11/28/23  CBC w/auto Differential (5 Part)  Result Value Ref Range   WBC (White Blood Cell Count) 8.1 4.1 - 10.2 10^3/uL   RBC (Red Blood Cell Count) 5.39 4.69 - 6.13 10^6/uL   Hemoglobin 15.7 14.1 - 18.1 gm/dL   Hematocrit 53.6 59.9 - 52.0 %   MCV (Mean Corpuscular Volume) 85.9 80.0 - 100.0 fl   MCH (Mean Corpuscular Hemoglobin) 29.1 27.0 - 31.2 pg   MCHC (Mean Corpuscular Hemoglobin Concentration) 33.9 32.0 - 36.0 gm/dL   Platelet Count 742 849 - 450 10^3/uL   RDW-CV (Red Cell  Distribution Width) 13.1 11.6 - 14.8 %   MPV (Mean Platelet Volume) 10.2 9.4 - 12.4 fl   Neutrophils 5.23 1.50 - 7.80 10^3/uL   Lymphocytes 1.82 1.00 - 3.60 10^3/uL   Monocytes 0.59 0.00 - 1.50 10^3/uL   Eosinophils 0.31 0.00 - 0.55 10^3/uL   Basophils 0.06 0.00 - 0.09 10^3/uL   Neutrophil % 65.0 32.0 - 70.0 %   Lymphocyte % 22.6 10.0 - 50.0 %   Monocyte % 7.3 4.0 - 13.0 %   Eosinophil % 3.8 1.0 - 5.0 %   Basophil% 0.7 0.0 - 2.0 %   Immature Granulocyte % 0.6 <=0.7 %   Immature Granulocyte Count 0.05 <=0.06 10^3/L  Comprehensive Metabolic Panel (CMP)  Result Value Ref Range   Glucose 99 70 - 110 mg/dL   Sodium 860 863 - 854 mmol/L   Potassium 4.4 3.6 - 5.1 mmol/L   Chloride 104 97 - 109 mmol/L   Carbon Dioxide (CO2) 28.8 22.0 - 32.0 mmol/L   Urea Nitrogen (BUN) 12 7 - 25 mg/dL   Creatinine 0.9 0.7 - 1.3 mg/dL   Glomerular Filtration Rate (eGFR) 101 >60 mL/min/1.73sq m   Calcium 9.6 8.7 - 10.3 mg/dL   AST  13 8 - 39 U/L   ALT  21 6 - 57 U/L   Alk Phos (alkaline Phosphatase) 66 34 - 104 U/L   Albumin 4.2 3.5 - 4.8 g/dL   Bilirubin, Total 0.6 0.3 - 1.2 mg/dL   Protein, Total 6.2 6.1 - 7.9 g/dL   A/G Ratio 2.1 1.0 - 5.0 gm/dL  Hemoglobin J8R  Result Value Ref Range   Hemoglobin A1C 5.7 (H) 4.2 - 5.6 %   Average Blood Glucose (Calc) 117 mg/dL   Narrative   Normal Range:    4.2 - 5.6% Increased Risk:  5.7 - 6.4% Diabetes:        >= 6.5% Glycemic Control for adults with diabetes:  <7%    Lipid Panel w/calc LDL  Result Value Ref Range   Cholesterol, Total 202 (H) 100 - 200 mg/dL   Triglyceride 874 35 - 199 mg/dL   HDL (High Density Lipoprotein) Cholesterol 44.9 29.0 - 71.0 mg/dL   LDL Calculated 867 (H) 0 - 130 mg/dL   VLDL Cholesterol 25 mg/dL   Cholesterol/HDL Ratio 4.5   Urinalysis w/Microscopic  Result Value Ref Range   Color Light Yellow Colorless, Straw, Light Yellow, Yellow, Dark Yellow   Clarity Clear Clear   Specific Gravity 1.016 1.005 - 1.030   pH, Urine 7.5  5.0 - 8.0   Protein, Urinalysis Negative Negative mg/dL   Glucose, Urinalysis Negative Negative mg/dL   Ketones, Urinalysis Negative Negative mg/dL   Blood, Urinalysis Negative Negative   Nitrite,  Urinalysis Negative Negative   Leukocyte Esterase, Urinalysis Negative Negative   Bilirubin, Urinalysis Negative Negative   Urobilinogen, Urinalysis 0.2 0.2 - 1.0 mg/dL   WBC, UA <1 <=5 /hpf   Red Blood Cells, Urinalysis <1 <=3 /hpf   Bacteria, Urinalysis 0-5 0 - 5 /hpf   Squamous Epithelial Cells, Urinalysis 0 /hpf  Thyroid Stimulating-Hormone (TSH)  Result Value Ref Range   Thyroid Stimulating Hormone (TSH) 1.999 0.450-5.330 uIU/ml uIU/mL  PSA, Total (Screen)  Result Value Ref Range   PSA (Prostate Specific Antigen), Total 4.03 (H) 0.10 - 4.00 ng/mL   Narrative   Test results were determined with Beckman Coulter Hybritech Assay. Values obtained with different assay methods cannot be used interchangeably in serial testing. Assay results should not be interpreted as absolute evidence of the presence or absence of malignant disease    BP 126/88   Pulse 90   Ht 175.3 cm (5' 9)   Wt (!) 144.7 kg (319 lb)   SpO2 98%   BMI 47.11 kg/m   GENERAL:  The patient is alert, oriented and in no acute distress.  HEENT:  Pupils equal and round.  EOMI. oropharynx is moist without lesions.  TMs clear NECK: no thyromegaly or lymphadenopathy. LUNGS:  Clear bilaterally without retractions or wheezing.   CARDIAC:  Regular rate and rhythm,  without murmurs, rubs or gallops. VASCULAR:  Carotid and radial pulses 2+.  Distal pulses 2+ ABDOMEN:  Soft, with normal bowel sounds. No organomegaly or tenderness.  No guard or rebound GENITALIA/RECTAL: Defer to Urology EXTREMITIES:  No cyanosis, clubbing.  No significant edema.  NEUROLOGIC:  Cranial nerves grossly intact.  Motor and sensory exams are intact and symmetrical    Routine general medical examination at a health care facility  (primary encounter  diagnosis) Other hyperlipidemia Prediabetes Obstructive sleep apnea syndrome Irritable bowel syndrome without diarrhea Rising PSA level Depression screening (Z13.31) Chronic pain of left knee  Assessment & Plan Obstructive sleep apnea No symptom progression. Continues diphenhydramine use. Aware of untreated sleep apnea risks. - Discuss weight loss medications if symptoms worsen.  He defers repeat sleep study.  Avoid weight gain.  Avoid sleeping on his back  Osteoarthritis of left knee Persistent pain managed with meloxicam. Awaiting orthopedic referral. - Resend orthopedic referral. - Continue meloxicam as needed.  Side effects reviewed.  Irritable bowel syndrome Bowel function stable.  Watch diet.  Good hydration.  Good bowel regimen  Hyperlipidemia Cholesterol at 202 mg/dL. Target below 200 mg/dL. - Encourage lifestyle modifications. - Monitor lipid levels.  Follow liver and thyroid  Prediabetes A1c slightly elevated. Discussed weight management to prevent diabetes progression. - Encourage weight management and lifestyle modifications. - Monitor A1c and metabolic panel.  Treatment guidelines reviewed  Elevated prostate specific antigen (PSA) PSA at 4.03, increasing over two years. Discussed need to rule out causes including prostate cancer. - Refer to urology for evaluation, consider prostate MRI.  Overweight/obesity (elevated BMI) Weight decreasing. Discussed dietary and activity changes. - Encourage dietary changes and increased physical activity. - Monitor weight at home.  Depression Sertraline effective as needed.  He is aware that we would recommend he take this daily.  Lifestyle efforts including attention to sleep, diet for this  General Health Maintenance Due for colonoscopy. Discussed vaccinations and eye exam. - Schedule colonoscopy. - Encourage Prevnar 20 and shingles vaccinations. - Recommend eye exam.  Follow-up in 6 months, returning sooner if  needed      BERT JACK KLEIN III, MD  Portions of  this note were created using dictation software and may contain typographical errors.   This note has been created using automated tools and reviewed for accuracy by BERT JACK KLEIN III.  Use of artificial intelligence tool (ABRIDGE) to help with documentation was discussed with patient. *Some images could not be shown.

## 2024-01-06 DIAGNOSIS — R972 Elevated prostate specific antigen [PSA]: Secondary | ICD-10-CM | POA: Insufficient documentation

## 2024-01-06 NOTE — Assessment & Plan Note (Signed)
 PSA 4.03 (Sept 2025) Slow uptrend from 2-3 baseline since 2023 DRE -40g, limited by body habitus, mildly nodular but symmetric  We discussed the significance of an elevated prostate-specific antigen (PSA) level. PSA is a nonspecific marker and may be elevated due to both benign and malignant causes. Benign factors include benign prostatic hyperplasia (BPH), prostatitis or urinary tract infection, recent ejaculation, catheterization or instrumentation, and advancing age. Malignant causes include prostate cancer of varying risk categories.  We reviewed that a single PSA value is less informative than following PSA trends over time, which can better reflect underlying pathology. Risk factors for prostate cancer include increasing age, family history of prostate cancer, African American race, and known germline mutations (e.g., BRCA2).  Next steps may include repeating PSA to confirm elevation, consideration of additional biomarkers or PSA derivatives (e.g., %free PSA, PSA density), and obtaining a multiparametric prostate MRI to assess for suspicious lesions. Based on PSA kinetics, risk profile, and MRI findings, a prostate biopsy may be recommended for definitive diagnosis.  - Offered 2 options: Interval PSA in 2-3 months vs proceeding with prostate MRI  -He preferred prostate MRI  -Reviewed outcomes including a negative MRI in which case we may still pursue a 12 core systematic biopsy versus positive MRI in which case we will proceed with MRI targeted biopsy

## 2024-01-06 NOTE — Progress Notes (Unsigned)
01/10/24 1:58 PM   Brandon Gillespie 1970/10/10 [id number]   HPI: 53 y.o. male here for initial evaluation of elevated PSA  Component Ref Range & Units 1 mo ago  PSA (Prostate Specific Antigen), Total 0.10 - 4.00 ng/mL 4.03 High    Denies any recent change in urinary habits, acute LUTS Denies history of GH, nephrolithiasis, prostatitis, UTI  Works as an Art therapist Never smoker No family history of prostate cancer    PMH: History reviewed. No pertinent past medical history.  Surgical History: History reviewed. No pertinent surgical history.  Family History: History reviewed. No pertinent family history.  Social History:  has no history on file for tobacco use, alcohol use, and drug use.      Physical Exam: BP (!) 139/97   Pulse 80   Ht 6' (1.829 m)   Wt (!) 321 lb 6.4 oz (145.8 kg)   BMI 43.59 kg/m    Constitutional:  Alert and oriented, No acute distress. Cardiovascular: No clubbing, cyanosis, or edema. Respiratory: Normal respiratory effort, no increased work of breathing. GI: Nondistended GU: DRE-40-50 g gland although limited by body habitus, apical to mid gland slightly nodular but smooth and symmetric, likely BPH Skin: No rashes, bruises or suspicious lesions. Neurologic: Grossly intact, no focal deficits, moving all 4 extremities. Psychiatric: Normal mood and affect.  Laboratory Data:  Component Ref Range & Units 1 mo ago  PSA (Prostate Specific Antigen), Total 0.10 - 4.00 ng/mL 4.03 High    Component Ref Range & Units 7 mo ago  PSA (Prostate Specific Antigen), Total 0.10 - 4.00 ng/mL 3.80   Component Ref Range & Units 1 yr ago  PSA (Prostate Specific Antigen), Total 0.10 - 4.00 ng/mL 3.76   Component Ref Range & Units 2 yr ago  PSA (Prostate Specific Antigen), Total 0.10 - 4.00 ng/mL 2.30     Pertinent Imaging: N/A    Assessment & Plan:    Elevated PSA Assessment & Plan: PSA 4.03 (Sept 2025) Slow uptrend from 2-3  baseline since 2023 DRE -40g, limited by body habitus, mildly nodular but symmetric  We discussed the significance of an elevated prostate-specific antigen (PSA) level. PSA is a nonspecific marker and may be elevated due to both benign and malignant causes. Benign factors include benign prostatic hyperplasia (BPH), prostatitis or urinary tract infection, recent ejaculation, catheterization or instrumentation, and advancing age. Malignant causes include prostate cancer of varying risk categories.  We reviewed that a single PSA value is less informative than following PSA trends over time, which can better reflect underlying pathology. Risk factors for prostate cancer include increasing age, family history of prostate cancer, African American race, and known germline mutations (e.g., BRCA2).  Next steps may include repeating PSA to confirm elevation, consideration of additional biomarkers or PSA derivatives (e.g., %free PSA, PSA density), and obtaining a multiparametric prostate MRI to assess for suspicious lesions. Based on PSA kinetics, risk profile, and MRI findings, a prostate biopsy may be recommended for definitive diagnosis.  - Offered 2 options: Interval PSA in 2-3 months vs proceeding with prostate MRI  -He preferred prostate MRI  -Reviewed outcomes including a negative MRI in which case we may still pursue a 12 core systematic biopsy versus positive MRI in which case we will proceed with MRI targeted biopsy  Orders: -     MR PROSTATE W WO CONTRAST; Future      Penne Skye, MD 01/10/2024  [facility] [address], [address] [place], [place]  [address] [phone] 

## 2024-01-10 ENCOUNTER — Encounter: Payer: Self-pay | Admitting: Urology

## 2024-01-10 ENCOUNTER — Ambulatory Visit: Admitting: Urology

## 2024-01-10 VITALS — BP 139/97 | HR 80 | Ht 72.0 in | Wt 321.4 lb

## 2024-01-10 DIAGNOSIS — R972 Elevated prostate specific antigen [PSA]: Secondary | ICD-10-CM | POA: Diagnosis not present

## 2024-01-10 NOTE — Patient Instructions (Signed)
 Please contact Central Scheduling to set up your prostate MRI at [phone] in 2 weeks. Please understand scan results can take up to at least 2 weeks to get reviewed.  Prostate MRI Prep:  1- No ejaculation 48 hours prior to exam  2- No caffeine or carbonated beverages on day of the exam  3- Eat light diet evening prior and day of exam  4- Avoid eating 4 hours prior to exam  5- Fleets enema needs to be done 4 hours prior to exam -See below. Can be purchased at the drug store.

## 2024-01-30 NOTE — Addendum Note (Signed)
 Addended byBETHA CORIE PLATER on: 01/30/2024 10:17 AM   Modules accepted: Orders

## 2024-02-08 ENCOUNTER — Ambulatory Visit

## 2024-02-18 ENCOUNTER — Ambulatory Visit
Admission: RE | Admit: 2024-02-18 | Discharge: 2024-02-18 | Disposition: A | Source: Ambulatory Visit | Attending: Urology

## 2024-02-18 DIAGNOSIS — R972 Elevated prostate specific antigen [PSA]: Secondary | ICD-10-CM

## 2024-02-18 MED ORDER — GADOPICLENOL 0.5 MMOL/ML IV SOLN
10.0000 mL | Freq: Once | INTRAVENOUS | Status: AC | PRN
  Administered 2024-02-18: 10 mL via INTRAVENOUS

## 2024-04-03 NOTE — Telephone Encounter (Signed)
 Called patient to inform him of his Prostate MRI results I did not get a response from patient. I left a voicemail letting him know I was calling to inform him of results. I sent a MyChart message to try and reach patient as well. No further comments. -Jeovanni Heuring,CMA.

## 2024-05-01 ENCOUNTER — Ambulatory Visit: Admit: 2024-05-01 | Admitting: Gastroenterology

## 2024-05-01 ENCOUNTER — Encounter: Admission: RE | Payer: Self-pay | Source: Home / Self Care

## 2024-05-01 SURGERY — COLONOSCOPY
Anesthesia: General
